# Patient Record
Sex: Female | Born: 1970 | Race: White | Hispanic: No | Marital: Married
Health system: Southern US, Community
[De-identification: ages and names within clinical notes are randomized; demographics above are authoritative.]

---

## 1998-08-28 ENCOUNTER — Other Ambulatory Visit: Admission: RE | Admit: 1998-08-28 | Discharge: 1998-08-28 | Payer: Self-pay | Admitting: Obstetrics and Gynecology

## 1999-10-08 ENCOUNTER — Other Ambulatory Visit: Admission: RE | Admit: 1999-10-08 | Discharge: 1999-10-08 | Payer: Self-pay | Admitting: Obstetrics and Gynecology

## 2000-11-14 ENCOUNTER — Other Ambulatory Visit: Admission: RE | Admit: 2000-11-14 | Discharge: 2000-11-14 | Payer: Self-pay | Admitting: Obstetrics and Gynecology

## 2001-12-25 ENCOUNTER — Other Ambulatory Visit: Admission: RE | Admit: 2001-12-25 | Discharge: 2001-12-25 | Payer: Self-pay | Admitting: Obstetrics and Gynecology

## 2003-01-07 ENCOUNTER — Other Ambulatory Visit: Admission: RE | Admit: 2003-01-07 | Discharge: 2003-01-07 | Payer: Self-pay | Admitting: Obstetrics and Gynecology

## 2004-01-29 ENCOUNTER — Other Ambulatory Visit: Admission: RE | Admit: 2004-01-29 | Discharge: 2004-01-29 | Payer: Self-pay | Admitting: Obstetrics and Gynecology

## 2005-03-07 ENCOUNTER — Other Ambulatory Visit: Admission: RE | Admit: 2005-03-07 | Discharge: 2005-03-07 | Payer: Self-pay | Admitting: Obstetrics and Gynecology

## 2006-03-29 ENCOUNTER — Ambulatory Visit: Payer: Self-pay | Admitting: Family Medicine

## 2006-09-14 ENCOUNTER — Inpatient Hospital Stay (HOSPITAL_COMMUNITY): Admission: AD | Admit: 2006-09-14 | Discharge: 2006-09-16 | Payer: Self-pay | Admitting: Obstetrics and Gynecology

## 2006-09-14 ENCOUNTER — Encounter (INDEPENDENT_AMBULATORY_CARE_PROVIDER_SITE_OTHER): Payer: Self-pay | Admitting: Specialist

## 2006-09-19 ENCOUNTER — Inpatient Hospital Stay (HOSPITAL_COMMUNITY): Admission: AD | Admit: 2006-09-19 | Discharge: 2006-09-21 | Payer: Self-pay | Admitting: Obstetrics and Gynecology

## 2006-09-19 ENCOUNTER — Ambulatory Visit: Payer: Self-pay | Admitting: Cardiology

## 2006-09-19 ENCOUNTER — Encounter: Payer: Self-pay | Admitting: Cardiology

## 2006-10-02 ENCOUNTER — Ambulatory Visit: Payer: Self-pay | Admitting: Cardiology

## 2006-11-24 ENCOUNTER — Ambulatory Visit: Payer: Self-pay | Admitting: Cardiology

## 2006-12-13 ENCOUNTER — Ambulatory Visit: Payer: Self-pay

## 2006-12-13 ENCOUNTER — Encounter: Payer: Self-pay | Admitting: Cardiology

## 2007-01-10 ENCOUNTER — Ambulatory Visit: Payer: Self-pay | Admitting: Family Medicine

## 2007-01-10 ENCOUNTER — Encounter: Payer: Self-pay | Admitting: Family Medicine

## 2007-01-10 DIAGNOSIS — J3089 Other allergic rhinitis: Secondary | ICD-10-CM | POA: Insufficient documentation

## 2007-01-10 DIAGNOSIS — R5383 Other fatigue: Secondary | ICD-10-CM

## 2007-01-10 DIAGNOSIS — J45909 Unspecified asthma, uncomplicated: Secondary | ICD-10-CM | POA: Insufficient documentation

## 2007-01-10 DIAGNOSIS — R609 Edema, unspecified: Secondary | ICD-10-CM | POA: Insufficient documentation

## 2007-01-10 DIAGNOSIS — F329 Major depressive disorder, single episode, unspecified: Secondary | ICD-10-CM

## 2007-01-10 DIAGNOSIS — F3289 Other specified depressive episodes: Secondary | ICD-10-CM | POA: Insufficient documentation

## 2007-01-10 DIAGNOSIS — R5381 Other malaise: Secondary | ICD-10-CM

## 2007-01-11 ENCOUNTER — Encounter: Payer: Self-pay | Admitting: Family Medicine

## 2007-01-12 LAB — CONVERTED CEMR LAB
ALT: 32 units/L (ref 0–40)
AST: 30 units/L (ref 0–37)
Bilirubin, Direct: 0.1 mg/dL (ref 0.0–0.3)
Creatinine, Ser: 0.8 mg/dL (ref 0.4–1.2)
Glucose, Bld: 94 mg/dL (ref 70–99)
HCT: 38.1 % (ref 36.0–46.0)
Potassium: 4.3 meq/L (ref 3.5–5.1)
Pro B Natriuretic peptide (BNP): 46 pg/mL (ref 0.0–100.0)
Total Bilirubin: 0.6 mg/dL (ref 0.3–1.2)
WBC: 6.3 10*3/uL (ref 4.5–10.5)

## 2007-01-16 ENCOUNTER — Encounter: Payer: Self-pay | Admitting: Family Medicine

## 2007-08-08 ENCOUNTER — Ambulatory Visit: Payer: Self-pay | Admitting: Family Medicine

## 2007-08-10 ENCOUNTER — Encounter: Admission: RE | Admit: 2007-08-10 | Discharge: 2007-08-10 | Payer: Self-pay | Admitting: Family Medicine

## 2007-08-13 ENCOUNTER — Telehealth (INDEPENDENT_AMBULATORY_CARE_PROVIDER_SITE_OTHER): Payer: Self-pay | Admitting: *Deleted

## 2007-08-13 ENCOUNTER — Encounter (INDEPENDENT_AMBULATORY_CARE_PROVIDER_SITE_OTHER): Payer: Self-pay | Admitting: Internal Medicine

## 2008-06-21 ENCOUNTER — Inpatient Hospital Stay (HOSPITAL_COMMUNITY): Admission: AD | Admit: 2008-06-21 | Discharge: 2008-06-24 | Payer: Self-pay | Admitting: Obstetrics and Gynecology

## 2008-06-25 ENCOUNTER — Inpatient Hospital Stay (HOSPITAL_COMMUNITY): Admission: AD | Admit: 2008-06-25 | Discharge: 2008-06-27 | Payer: Self-pay | Admitting: Obstetrics and Gynecology

## 2008-07-01 ENCOUNTER — Encounter: Payer: Self-pay | Admitting: Internal Medicine

## 2008-07-01 ENCOUNTER — Ambulatory Visit: Payer: Self-pay | Admitting: Internal Medicine

## 2008-07-01 ENCOUNTER — Ambulatory Visit: Payer: Self-pay

## 2008-07-01 LAB — CONVERTED CEMR LAB
Basophils Relative: 0.9 % (ref 0.0–3.0)
CO2: 28 meq/L (ref 19–32)
Chloride: 106 meq/L (ref 96–112)
GFR calc Af Amer: 90 mL/min
GFR calc non Af Amer: 74 mL/min
Glucose, Bld: 84 mg/dL (ref 70–99)
Hemoglobin: 11.9 g/dL — ABNORMAL LOW (ref 12.0–15.0)
MCHC: 33.3 g/dL (ref 30.0–36.0)
MCV: 85.6 fL (ref 78.0–100.0)
Monocytes Absolute: 0.8 10*3/uL (ref 0.1–1.0)
Neutrophils Relative %: 57.9 % (ref 43.0–77.0)
Platelets: 309 10*3/uL (ref 150–400)
Potassium: 3.6 meq/L (ref 3.5–5.1)
RBC: 4.18 M/uL (ref 3.87–5.11)
RDW: 12.7 % (ref 11.5–14.6)
WBC: 6.8 10*3/uL (ref 4.5–10.5)

## 2008-07-14 ENCOUNTER — Ambulatory Visit: Payer: Self-pay | Admitting: Internal Medicine

## 2008-07-14 LAB — CONVERTED CEMR LAB
BUN: 17 mg/dL (ref 6–23)
Basophils Relative: 1.7 % (ref 0.0–3.0)
CO2: 31 meq/L (ref 19–32)
Chloride: 104 meq/L (ref 96–112)
Creatinine, Ser: 0.8 mg/dL (ref 0.4–1.2)
Eosinophils Relative: 4.3 % (ref 0.0–5.0)
GFR calc Af Amer: 103 mL/min
Glucose, Bld: 95 mg/dL (ref 70–99)
HCT: 38.6 % (ref 36.0–46.0)
Hemoglobin: 13 g/dL (ref 12.0–15.0)
Lymphocytes Relative: 35.2 % (ref 12.0–46.0)
MCV: 83.7 fL (ref 78.0–100.0)
Monocytes Absolute: 0.7 10*3/uL (ref 0.1–1.0)
Neutro Abs: 2.6 10*3/uL (ref 1.4–7.7)
Platelets: 319 10*3/uL (ref 150–400)
RBC: 4.6 M/uL (ref 3.87–5.11)
WBC: 5.6 10*3/uL (ref 4.5–10.5)

## 2008-10-14 ENCOUNTER — Encounter: Payer: Self-pay | Admitting: Family Medicine

## 2008-12-24 ENCOUNTER — Encounter: Payer: Self-pay | Admitting: Family Medicine

## 2010-09-14 ENCOUNTER — Ambulatory Visit
Admission: RE | Admit: 2010-09-14 | Discharge: 2010-09-14 | Payer: Self-pay | Source: Home / Self Care | Attending: Family Medicine | Admitting: Family Medicine

## 2010-09-14 ENCOUNTER — Telehealth (INDEPENDENT_AMBULATORY_CARE_PROVIDER_SITE_OTHER): Payer: Self-pay | Admitting: *Deleted

## 2010-09-15 LAB — CONVERTED CEMR LAB
AST: 23 units/L (ref 0–37)
Albumin: 3.8 g/dL (ref 3.5–5.2)
Basophils Relative: 0.5 % (ref 0.0–3.0)
Bilirubin, Direct: 0.1 mg/dL (ref 0.0–0.3)
Calcium: 9 mg/dL (ref 8.4–10.5)
Chloride: 103 meq/L (ref 96–112)
Glucose, Bld: 80 mg/dL (ref 70–99)
HDL: 65.6 mg/dL (ref >39.00)
LDL Cholesterol: 101 mg/dL — ABNORMAL HIGH (ref 0–99)
Lymphocytes Relative: 29.8 % (ref 12.0–46.0)
Lymphs Abs: 1.5 10*3/uL (ref 0.7–4.0)
MCHC: 33.4 g/dL (ref 30.0–36.0)
Monocytes Absolute: 0.5 10*3/uL (ref 0.1–1.0)
Monocytes Relative: 10.2 % (ref 3.0–12.0)
Neutro Abs: 2.8 10*3/uL (ref 1.4–7.7)
Neutrophils Relative %: 57 % (ref 43.0–77.0)
Platelets: 263 10*3/uL (ref 150.0–400.0)
RBC: 3.83 M/uL — ABNORMAL LOW (ref 3.87–5.11)
Sodium: 137 meq/L (ref 135–145)
Total Protein: 7 g/dL (ref 6.0–8.3)
Triglycerides: 47 mg/dL (ref 0.0–149.0)
VLDL: 9.4 mg/dL (ref 0.0–40.0)

## 2010-09-16 ENCOUNTER — Ambulatory Visit
Admission: RE | Admit: 2010-09-16 | Discharge: 2010-09-16 | Payer: Self-pay | Source: Home / Self Care | Attending: Family Medicine | Admitting: Family Medicine

## 2010-09-16 DIAGNOSIS — R4589 Other symptoms and signs involving emotional state: Secondary | ICD-10-CM | POA: Insufficient documentation

## 2010-10-05 ENCOUNTER — Ambulatory Visit
Admission: RE | Admit: 2010-10-05 | Discharge: 2010-10-05 | Payer: Self-pay | Source: Home / Self Care | Attending: Psychology | Admitting: Psychology
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
## 2010-10-14 NOTE — Progress Notes (Signed)
----   Converted from flag ---- ---- 09/12/2010 4:50 PM, Judith Part MD wrote: please check wellness and lipid v70.0 thanks  ---- 09/10/2010 1:29 PM, Liane Comber CMA (AAMA) wrote: Lab orders please! Good Morning! This pt is scheduled for cpx labs Tuesday, which labs to draw and dx codes to use? Thanks Tasha ------------------------------

## 2010-10-19 ENCOUNTER — Ambulatory Visit (INDEPENDENT_AMBULATORY_CARE_PROVIDER_SITE_OTHER): Payer: PRIVATE HEALTH INSURANCE | Admitting: Psychology

## 2010-10-19 DIAGNOSIS — F4323 Adjustment disorder with mixed anxiety and depressed mood: Secondary | ICD-10-CM

## 2010-11-02 ENCOUNTER — Ambulatory Visit: Payer: PRIVATE HEALTH INSURANCE | Admitting: Psychology

## 2011-01-25 NOTE — H&P (Signed)
NAMEKARINNA, BEADLES NO.:  [id number]   MEDICAL RECORD NO.:  [id number]          PATIENT TYPE:  INP   LOCATION:                                FACILITY:  [facility]   PHYSICIAN:  Juluis Mire, M.D.   DATE OF BIRTH:  02/13/1971   DATE OF ADMISSION:  06/21/2008  DATE OF DISCHARGE:                              HISTORY & PHYSICAL   A 40 year old, gravida 3, para 1, abortus 1, female estimated  gestational age of [redacted] weeks presents for primary cesarean section.   In relation to present admission, the patient has been followed  throughout her pregnancy.  She has had a large estimated fetal weight  ultrasound done on June 16, 2008, revealed an estimated fetal weight  of 9-1/2 pound.  We discussed the option of proceeding with induction  versus primary cesarean section.  The patient has chosen primary  cesarean section with the concern of the shoulder dystocia.   Prenatal course was complicated by a first trimester screening that  indicated increased risk of Down syndrome.  We had discussed  amniocentesis declined by the patient.  Followup ultrasounds have been  normal.  Early ultrasound did reveal bilateral renal pyelectasis on the  baby; however, followup scans have revealed at that has resolved.   In terms of allergies, no known drug allergies.   MEDICATION:  Prenatal vitamins.   For past medical history, family history, and social history, please see  prenatal records.   REVIEW OF SYSTEMS:  Noncontributory.   PHYSICAL EXAMINATION:  VITAL SIGNS:  Afebrile, stable vital signs.  HEENT:  The patient is normocephalic.  Pupils equal and reactive to  light and accommodation.  Extraocular movements were intact.  Sclerae  and conjunctivae clear.  Oropharynx clear.  NECK:  Without thyromegaly.  BREASTS:  Not examined.  LUNGS:  Clear.  CARDIOVASCULAR:  Regular rhythm and rate without murmurs or gallops.  ABDOMEN:  Gravid uterus consistent with dates.  PELVIC:  Cervix is  1-2, 50% effaced, vertex at -1 station.  EXTREMITIES:  Trace edema.  NEUROLOGIC:  Grossly within normal limits.   IMPRESSION:  Intrauterine pregnancy at term with macrosomia.   PLAN:  The patient has decided to proceed with primary cesarean section.  The alternative of vaginal delivery have been discussed.  Risks of  cesarean section explained including the risk of infection, the risk of  hemorrhage that could require transfusion with the risk of AIDS or  hepatitis, excessive bleeding could require hysterectomy, there is  a risk of injury to adjacent organs including bladder, bowel, ureters  that could require further exploratory surgery, risk of deep venous  thrombosis and pulmonary embolus.  The patient expressed understanding  of the indications and risks.      Juluis Mire, M.D.  Electronically Signed     JSM/MEDQ  D:  06/21/2008  T:  06/21/2008  Job:  [id number]

## 2011-01-25 NOTE — Op Note (Signed)
NAMEAMRI, LIEN             ACCOUNT NO.:  [id number]   MEDICAL RECORD NO.:  [id number]          PATIENT TYPE:  INP   LOCATION:  9121                          FACILITY:  [facility]   PHYSICIAN:  Juluis Mire, M.D.   DATE OF BIRTH:  [birth year]   DATE OF PROCEDURE:  06/21/2008  DATE OF DISCHARGE:                               OPERATIVE REPORT   PREOPERATIVE DIAGNOSIS:  Pregnancy at term with macrosomia.   POSTOPERATIVE DIAGNOSIS:  Pregnancy at term with macrosomia.   PROCEDURE:  Low-transverse cesarean section.   SURGEON:  Juluis Mire, MD   ANESTHESIA:  Spinal.   ESTIMATED BLOOD LOSS:  500-600 mL.   PACKS AND DRAINS:  None.   INTRAOPERATIVE BLOOD PLACED:  None.   COMPLICATIONS:  None.   INDICATIONS:  Are as dictated in history and physical.   PROCEDURE IN DETAIL:  The patient was taken to the OR and placed in the  supine position with left lateral tilt.  After satisfactory level of  spinal anesthesia obtained, the abdomen was prepped out with Betadine  and draped in sterile field.  Low transverse skin incision was made with  a knife and carried through the subcutaneous tissue.  Fascia was entered  sharply and incision fashioned laterally.  Fascia taken off the muscle  superiorly and inferiorly.  Rectus muscles were separated in the  midline.  Perineum was entered sharp and incision of perineum extended  both superiorly and inferiorly.  A low transverse bladder flap was  developed.  A low transverse uterine incision was begun with a knife and  extended laterally using manual traction.  The infant delivered with  fundal pressure and use of the vacuum extractor.  The infant was a  viable female.  Apgars were 8 and 9, umbilical artery pH 7.30.  Weight is  pending.  Placenta was delivered manually and sent to L&D.  The uterus  was exteriorized for closure.  Uterus was closed with running locking  sutures  of 0 chromic using 2-layer closure technique.  We had good  hemostasis.   Urine output was clear and adequate.  Tubes and ovaries  were unremarkable.  Uterus was returned to the abdominal cavity.  We  irrigated the pelvis, had good hemostasis with clear urine output.  Muscles and peritoneum closed with running  suture of 3-0 Vicryl.  Fascia closed with running suture of 0 PDS.  Skin  was closed with staples and Steri-Strips.  Sponge, instrument, and  needle count was correct by circulating nurse x2.  Foley catheter  remained clear at the time of closure.  The patient tolerated the  procedure well and was returned to the recovery in good condition      Juluis Mire, M.D.  Electronically Signed     JSM/MEDQ  D:  06/21/2008  T:  06/21/2008  Job:  [id number]

## 2011-01-25 NOTE — Assessment & Plan Note (Signed)
[facility]                            CARDIOLOGY OFFICE NOTE   NAME:[name], [name]                    MRN:          [id number]  DATE:07/14/2008                            DOB:          05-09-1971    INTRODUCTION:  [name] is a pleasant 40 year old female with a  history of prior pregnancy-induced hypertension, who presents today for  followup after her recent pregnancy.   PROBLEMS:  1. Pregnancy-induced hypertension.  2. Obesity.  3. G2, P2.  4. Status post C-section on June 21, 2008.  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   MEDICATIONS:  1. Lasix 20 mg daily.  2. Tylenol p.r.n.   PROCEDURES:  Transthoracic echocardiogram on October, 20, 2009 revealed  a left ventricular ejection fraction of 55% with no left ventricular  regional wall motion abnormalities.  Mild mitral valve regurgitation was  noted.   INTERVAL HISTORY:  [name] presents today for followup.  She was  recently evaluated by me on July 01, 2008 after a recent pregnancy.  She was found to have significant volume overload upon examination and  complained of progressive shortness of breath and weight gain.  An  echocardiogram was performed, which revealed a preserved ejection  fraction as noted above.  The patient was initiated on Lasix 20 mg  daily.  She reports doing very well since that time.  Her shortness of  breath has resolved.  She denies chest pain, palpitations, presyncope,  or syncope.  The patient reports bitemporal headaches intermittently,  which she attributes to stress and sleep deprivation.  Her baby is doing  well, but sleeps approximately only an 1 hour between feedings and has  had some symptoms of reflux.  The patient is otherwise doing well today.   PHYSICAL EXAMINATION:  VITAL SIGNS:  Blood pressure 112/76, heart rate  72, weight 256 pounds, and respirations 18.  GENERAL:  The patient is a well-appearing female in no acute  distress.  She is alert and oriented x3.  HEENT:  Normocephalic and atraumatic.  Sclerae clear.  Conjunctivae  pink.  Oropharynx clear.  NECK:  Supple.  JVP 7 cm.  LUNGS:  Clear.  HEART:  Regular and rhythm.  No murmurs, rubs, or gallops.  GI:  Soft, nontender, and nondistended.  Positive bowel sounds.  EXTREMITIES:  No clubbing, cyanosis, or edema.  NEUROLOGIC:  Strength and sensation are intact.  SKIN:  No ecchymoses or lacerations.  MUSCULOSKELETAL:  No deformity or atrophy.   IMPRESSION:  [name] is a very pleasant female 40 year old female  with recent pregnancy, who now presents for followup of postpartum  volume retention.  Her recent echocardiogram reveals a preserved  ejection fraction.  Her symptoms of shortness of breath have resolved  with gentle diuresis.   PLAN:  1. Daily weights and salt restrictions were again encouraged.  2. The patient is encouraged to check her blood pressure several times      per week and to contact my office should she have significant      elevations in her blood pressure.  3. The patient is instructed to  decrease her Lasix to 20 mg every      other day x3 days and then discontinue Lasix.  Again, she is      instructed to contact my office should she have significant      symptoms of volume retention.  4. The patient will follow up with her obstetrician as previously      scheduled in 3 weeks.  She is aware that she may follow up in my      office at anytime.     Hillis Range, MD  Electronically Signed    JA/MedQ  DD: 07/14/2008  DT: 07/15/2008  Job #: [id number]   cc:   Duke Salvia. Marcelle Overlie, M.D.  Teresa Coombs, MD

## 2011-01-25 NOTE — Assessment & Plan Note (Signed)
Erin [name]                         ELECTROPHYSIOLOGY OFFICE NOTE   NAME:[name], [name]                    MRN:          [id number]  DATE:07/01/2008                            DOB:          01/25/1971    INTRODUCTION:  Erin [name] is a pleasant 40 year old female with a  history of prior pregnancy-induced hypertension, who now presents for  followup after a recent pregnancy.   INTERVAL HISTORY:  Erin [name] initially presented to [facility]  in 2008 following pregnancy.  At that time, she presented with symptoms  of progressive shortness of breath, volume overload.  A transthoracic  echocardiogram was performed which revealed a preserved ejection  fraction.  The patient was treated conservatively with Lasix and did  quite well.  She reports that following her early postpartum state, she  returned to her baseline state of health.  At baseline, the patient has  done well over the past year.  She reports good exercise tolerance  without chest pain, shortness of breath, or other symptoms.  She  subsequently has become pregnant again and delivered by C-section on  June 21, 2008.  She reports mild elevation in her blood pressure  preoperatively as well as postprocedural hypertension.  She returned to  the hospital and was admitted between June 25, 2008, and June 27, 2008.  She reports receiving intravenous Lasix with significant  improvement in volume overload.  She continues to have significant  orthopnea and sleeps upright at night.  She reports shortness of breath  with mental activities.  She reports chest soreness with deep  inspiration, but denies chest pain, pleuritic chest pain, palpitations,  nausea, vomiting, or presyncope.  She has had intermittent bitemporal  headaches over the past 2-3 days without changes in vision or neurologic  sequela.  She was evaluated by Dr. Marcelle Overlie in clinic today and was  referred to our office for  further evaluation.   PAST MEDICAL HISTORY:  1. Obesity.  2. G2, P2.  3. Status post C-section on June 21, 2008.  4. 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   MEDICATIONS:  1. Multivitamin daily.  2. Ibuprofen p.r.n.   ALLERGIES:  HYDROCODONE causes nausea.   SOCIAL HISTORY:  [place].  She  denies tobacco, alcohol, or drug use.   FAMILY HISTORY:  She denies hypertension, diabetes, or coronary artery  disease.   REVIEW OF SYSTEMS:  All systems are reviewed and negative except as  outlined in the HPI above and as documented below.  The patient reports  loose watery stools over the past 3 days.   PHYSICAL EXAMINATION:  VITAL SIGNS:  Blood pressure 136/92, heart rate  59, respirations 18, weight 261 pounds.  GENERAL:  The patient is an obese female, who is recovering postpartum.  She is in no acute distress.  She is alert and oriented x3.  HEENT:  Normocephalic, atraumatic.  Sclerae clear.  Conjunctiva pink.  Oropharynx clear.  NECK:  Supple.  No lymphadenopathy or thyromegaly.  JVP 12 cm.  LUNGS:  Few bibasilar rales are noted with a normal work of breathing.  HEART:  Regular rate and rhythm.  A 2/6 flow murmur is noted along the  left sternal border and S3 is noted.  GI:  Soft, nontender, nondistended, positive bowel sounds.  She has a  healing surgical scar.  EXTREMITIES:  A 2+ pitting edema bilaterally.  NEUROLOGIC:  Strength and sensation are intact.  SKIN:  No ecchymosis or laceration.  MUSCULOSKELETAL:  No deformity or atrophy.  PSYCH:  Euthymic mood.  Full affect.   EKG today reveals sinus bradycardia at 58 beats per minute and is  otherwise unremarkable.   Transthoracic echocardiogram performed today, reveals an ejection  fraction of 55% with no regional wall motion abnormalities.  There is no  significant valvular abnormality.   IMPRESSION:  Erin [name] is a very pleasant 40 year old female with  recent  pregnancy, who now presents with postpartum volume retention and  symptoms of heart failure.  She has a preserved ejection fraction on  echocardiogram today and no ischemic changes on her EKG.  Her clinical  exam is consistent with fluid overload and I think that this is the  cause for her symptoms.   PLAN:  Therapeutic strategies for postpartum volume retention were  discussed in detail with the patient.  At the present time, I think the  most prudent strategy is salt restriction and gentle diuresis.  I  therefore placed the patient on Lasix 20 mg twice daily for 3 days, then  20 mg daily thereafter.  She is instructed to follow her weight daily.  We will obtain a complete metabolic profile today.  I have asked the  patient to return to clinic to see me in 1 week to assess her progress.  She is aware that should any problems arise that she should contact our  office.     Hillis Range, MD  Electronically Signed    JA/MedQ  DD: 07/01/2008  DT: 07/02/2008  Job #: [id number]   cc:   Duke Salvia. Marcelle Overlie, M.D.  Juluis Mire, M.D.

## 2011-01-28 NOTE — Discharge Summary (Signed)
NAMEGRAYSON, WHITE             ACCOUNT NO.:  [id number]   MEDICAL RECORD NO.:  [id number]          PATIENT TYPE:  INP   LOCATION:  9121                          FACILITY:  [facility]   PHYSICIAN:  Freddy Finner, M.D.   DATE OF BIRTH:  04-29-1971   DATE OF ADMISSION:  06/21/2008  DATE OF DISCHARGE:  06/24/2008                               DISCHARGE SUMMARY   ADMITTING DIAGNOSES:  1. Intrauterine pregnancy at 68 weeks' estimated gestational age.  2. Macrosomia.   DISCHARGE DIAGNOSES:  1. Status post low-transverse cesarean section.  2. Viable female infant.   PROCEDURE:  Primary low-transverse cesarean section.   REASON FOR ADMISSION:  Please see dictated H&P.   HOSPITAL COURSE:  The patient is a 40 year old gravida 3, para 1 that  was admitted to [facility] for scheduled cesarean  section.  The patient had undergone an ultrasound in the office for  estimate of fetal weight was greater than 9-1/2 pounds.  Decision was  made to schedule the patient for primary cesarean delivery due to  concern regarding possible shoulder dystocia.  On the morning of  admission, the patient was taken to the operating room where spinal  anesthesia was administered without difficulty.  A low-transverse  incision was made with delivery of a viable female infant weighing 10  pounds 5 ounces with Apgars of 8 at one minute and 9 at five minutes.  Arterial cord pH was 7.30.  The patient tolerated procedure well and was  taken to recovery room in stable condition.  On postoperative day #1,  the patient was without complaint.  Vital signs were stable.  She was  afebrile.  Abdomen soft.  Fundus firm and nontender.  Abdominal dressing  noted to be clean, dry, and intact.  Laboratory findings showed  hemoglobin of 10.3.  Postoperative day #2, the patient was without  complaint.  Vital signs were stable.  She was afebrile.  Fundus firm and  nontender.  Abdominal dressing had been removed revealing an  incision  that was clean, dry, and intact.  On postoperative day #3, the patient  continued to be without complaint.  Vital signs were stable.  She was  afebrile.  Fundus firm and nontender.  Incision was clean, dry, and  intact.  Staples removed, and the patient was later discharged home.   CONDITION ON DISCHARGE:  Stable.   DIET:  Regular as tolerated.   ACTIVITY:  No heavy lifting, no driving x2 weeks, no vaginal entry.   FOLLOWUP:  The patient to follow up in the office in 1-2 weeks for an  incision check.  She is to call for temperature greater than 100  degrees, persistent nausea, vomiting, heavy vaginal bleeding, and/or  redness or drainage from the incisional site.   DISCHARGE MEDICATIONS:  Tylox #30, 1 p.o. every 4-6 hours.  Motrin 600  mg every 6 hours.  Prenatal vitamins 1 p.o. daily.  Colace 1 p.o. daily.      Julio Sicks, N.P.      Freddy Finner, M.D.  Electronically Signed    CC/MEDQ  D:  07/24/2008  T:  07/24/2008  Job:  [id number]

## 2011-01-28 NOTE — Consult Note (Signed)
[name], [name]             ACCOUNT NO.:  [id number]   MEDICAL RECORD NO.:  [id number]          PATIENT TYPE:  INP   LOCATION:  9374                          [facility]  [facility]   PHYSICIAN:  Madolyn Frieze. Jens Som, MD, FACCDATE OF BIRTH:  1970/12/18   DATE OF CONSULTATION:  09/19/2006  DATE OF DISCHARGE:                                 CONSULTATION   [name] is a 40 year old female who is status post recent delivery  who we are asked to evaluate for chest pain and shortness of breath.  She has no prior cardiac history.  She had an uncomplicated vaginal  delivery on September 14, 2006.  Note, this is her first child.  Since that  time, she has noticed increased dyspnea on exertion as well as orthopnea  and pedal edema.  She also has chest tightness but has noticed this  increases with stressful situations in the past and this is similar.  Note, her daughter is at [facility] with increased bilirubin.  Because of her dyspnea and chest tightness, we were asked to further  evaluate.  Also of note, she has no history of syncope.   MEDICATIONS AT HOME:  Multivitamin.   FAMILY HISTORY:  Negative for coronary artery disease.   PAST MEDICAL HISTORY:  Unremarkable with no hypertension, diabetes  mellitus, or hyperlipidemia.  She has no prior surgical history.   ALLERGIES:  HYDROCODONE.   REVIEW OF SYSTEMS:  She denies any headaches, fevers or chills.  There  is no productive cough or hemoptysis.  There is no dysphagia,  odynophagia, melena, or hematochezia.  There is no dysuria or hematuria.  No seizure activity.  She does have orthopnea but there is no PND.  She  has noticed worsening pedal edema.  She is under increased stress due to  her child being at [facility].  The remainder of the review of  systems are negative.   PHYSICAL EXAMINATION:  VITAL SIGNS:  Blood pressure 144/91 and pulse is  45.  GENERAL:  She is well-developed and somewhat obese.  She is in no  acute  distress.  She does not appear to be depressed.  SKIN:  Warm and dry.  There is no peripheral clubbing.  BACK:  Normal.  HEENT:  Unremarkable with normal eyelids.  NECK:  Supple with a normal upstroke bilaterally and I could not  appreciate bruits.  There is no jugular venous distension.  I cannot  appreciate thyromegaly.  CHEST:  Clear to auscultation, normal expansion.  CARDIOVASCULAR:  Exam reveals a regular rhythm but a bradycardic rate.  There are no murmurs, rubs or gallops noted.  ABDOMEN:  Significant for right upper quadrant tenderness.  I cannot  palpate masses or hepatomegaly.  There was no abdominal bruit.  She has  2+ femoral pulses bilaterally.  No bruits.  EXTREMITIES:  Show 1+ edema bilaterally.  I cannot palpate cords.  She  has 1+ dorsalis pedis pulses bilaterally.  NEUROLOGICAL:  Exam is grossly intact.   LABORATORY DATA:  Her electrocardiogram shows a marked sinus bradycardia  rate at 45.  The axis is  normal.  There are no ST changes noted.  Her  chest x-ray shows mild cardiomegaly and interstitial edema.  Urinalysis  is unremarkable.  Her sodium is 141, potassium 5, BUN 15, creatinine  0.82.  Her albumin is 2.3.  Liver functions elevated with an SGOT of 65  and SGPT of 61.  Her bilirubin and alkaline phosphatase are normal.  Her  white blood cell count is 8.4, hemoglobin 10.1, hematocrit 29.1,  platelet count is 285.   DIAGNOSIS:  1. Mild volume overload/pulmonary edema.  2. Possible pregnancy induced hypertension.   PLAN:  [name] has been admitted with symptoms of dyspnea, pedal  edema, orthopnea, and has evidence of edema on her x-ray.  I think we  should proceed with diuresis and we will need to follow her renal  function closely.  We will schedule an echocardiogram to quantify left  ventricular function.  Her blood pressure also needs to be controlled  and IV magnesium has been initiated.  We will be happy to follow while  she is in the  hospital.  Note, her heart rate is decreased but there is  no symptoms related to this including no history of syncope.  I would  recommend following on telemetry.  If there is no further bradycardia  and no symptoms, then there is no therapy indicated for this is.  We  will be happy to follow while she is in the hospital.      Madolyn Frieze. Jens Som, MD, [facility]  Electronically Signed     BSC/MEDQ  D:  09/19/2006  T:  09/19/2006  Job:  [id number]

## 2011-01-28 NOTE — Assessment & Plan Note (Signed)
[facility] HEALTHCARE                            CARDIOLOGY OFFICE NOTE   [name]                    MRN:          [id number]  DATE:10/02/2006                            DOB:          06/17/1971    [name] is a very pleasant 40 year old female that I recently saw  at [facility] after delivery of her first child.  At that time,  she had noticed dyspnea and chest tightness.  She also had an  electrocardiogram that showed a marked sinus bradycardia with a rate of  45.  We felt that she was volume overloaded and gave her Lasix with  significant improvement in her symptoms.  An echocardiogram was  performed on September 19, 2006.  Her LV function was felt to be low normal  and the estimated ejection fraction was 50%.  There was no other  abnormalities noted per Dr. Myrtis Ser' reading.  Also note her liver  functions were minimally elevated as well and she was treated for  possible pregnancy-induced hypertension with magnesium.  Since  discharge, she is much improved.  She denies any dyspnea, chest pain,  palpitations, syncope, chest pain or pedal edema.  Her medications at  present include Lasix 10 mg p.o. daily and a prenatal vitamin.   PHYSICAL EXAMINATION:  Her physical examination today shows a blood  pressure of 132/74 and her pulse is 73.  CHEST:  Clear.  CARDIOVASCULAR:  Regular rate and rhythm.  EXTREMITIES:  Show no edema.   Electrocardiogram shows a sinus rhythm at a rate of 73.  There are no ST  changes noted.   DIAGNOSES:  1. Status post recent delivery of her first child.  2. Probable recent pregnancy-induced hypertension now improved.  3. Volume overload, now improved.   PLAN:  [name] is much improved since discharge.  I have asked her  to discontinue her Lasix and follow her symptoms.  If she develops  worsening shortness of breath or weight gain, then we can resume that as  needed.  I will see her back in approximately  3 months and we will most  likely repeat her electrocardiogram at that time.  Her EF previously was  low normal in the 50% range.  I want to make sure that she is not  developing a cardiomyopathy.     Erin Frieze Jens Som, MD, [facility]  Electronically Signed    BSC/MedQ  DD: 10/02/2006  DT: 10/02/2006  Job #: [id number]

## 2011-01-28 NOTE — Assessment & Plan Note (Signed)
[facility] HEALTHCARE                            CARDIOLOGY OFFICE NOTE   [name], [name]                    MRN:          [id number]  DATE:11/24/2006                            DOB:          Dec 06, 1970    [name] returns for followup today.  Please refer to my previous  notes for details.  Since I last saw her, she is doing well.  She is  back to exercising, using the elliptical trainer, and lifting weights.  She denies any dyspnea, orthopnea, PND, chest pain, palpitations, or  syncope.  She does state that there is mild pedal edema at times.   Her medications are prenatal vitamins and Tri-Levlen 28.   PHYSICAL EXAMINATION:  Shows a blood pressure of 121/74 and her pulse is  58.  Her chest is clear.  CARDIAC EXAM:  Regular rate and rhythm.  EXTREMITIES:  Show trace edema.   DIAGNOSES:  1. Recent probable pregnancy-induced hypertension.  2. Volume overload, now improved.  3. History of ejection fraction of approximately 50%.   PLAN:  [name] is much better since I last saw her.  She has had no  dyspnea, although she does have minimal pedal edema off of her Lasix.  I  think we can continue with her present medications, and she is  continuing with diet and exercise.  We will plan to repeat her  echocardiogram to make sure that she did not develop a pregnancy-induced  cardiomyopathy.  If her ejection fraction is 50% or higher, then I will  see her back on an as needed basis.     Madolyn Frieze Jens Som, MD, [facility]  Electronically Signed    BSC/MedQ  DD: 11/24/2006  DT: 11/25/2006  Job #: [id number]

## 2011-01-28 NOTE — Discharge Summary (Signed)
[name], HOCEVAR             ACCOUNT NO.:  [id number]   MEDICAL RECORD NO.:  [id number]          PATIENT TYPE:  INP   LOCATION:  9372                          [facility]  [facility]   PHYSICIAN:  Michelle L. Grewal, M.D.DATE OF BIRTH:  10-01-1970   DATE OF ADMISSION:  06/25/2008  DATE OF DISCHARGE:  06/27/2008                               DISCHARGE SUMMARY   ADMITTING DIAGNOSIS:  Status post cesarean delivery with postpartum  pregnancy-induced hypertension.   DISCHARGE DIAGNOSIS:  Status post cesarean delivery with postpartum  pregnancy-induced hypertension, resolving.   REASON FOR ADMISSION:  Please see written H&P.   HOSPITAL COURSE:  The patient is a 40 year old female that delivered on  June 21, 2008, that presented to the office with complaints of  shortness of breath and increase in lower extremity edema.  The patient  had had postpartum PIH with her last pregnancy.  The patient was now and  observed in the MAU where blood pressure was 130/88 with mild elevation  in SGOT and SGPT.  Oxygen saturations were 98% on room air with chest x-  ray revealing some mild cardiomyopathy with no overt pulmonary edema.  Vital signs were otherwise stable.  Lungs were clear with good air  movement.  Abdomen benign.  The patient was now admitted for magnesium  sulfate administration, IV Lasix, and continued monitoring.  On the  following morning, the patient did have a mild headache, otherwise she  was without complaint.  Vital signs were stable with blood pressure  130/80s, weight was noted to be down 2 pounds, oxygen sat was 97-100%,  urine output had been good approximately 200 mL per hour.  Laboratory  findings revealed hemoglobin of 10.0, platelet count of 319,000, SGOT  was 52, and SGPT was 45.  Edema remained 3+.  Lungs were clear to  auscultation.  Deep tendon reflexes were 3+.  The patient continued on  magnesium sulfate and was given some Lasix IV and total amount of IV  fluids  was decreased and laboratories were ordered for the following  morning.  On hospital day #3, the patient was feeling better.  She  denied headache, blurred vision, or right upper quadrant pain.  Vital  signs were stable.  She was afebrile.  Lungs were clear to auscultation.  Abdomen was soft with incision clean, dry, and intact.  Edema was now  1+.  PIH labs were improving, discharge was discussed with the patient,  magnesium sulfate was discontinued, and the patient was later discharged  to home.   CONDITION ON DISCHARGE:  Stable.   DIET:  Regular as tolerated.   ACTIVITY:  As tolerated.   INSTRUCTIONS:  The patient was encouraged to call for headache, blurred  vision, or right upper quadrant pain or increasing edema or feeling  jittery.  The patient was also instructed no heavy lifting, no driving  for 2 weeks, no vaginal entry.   DISCHARGE MEDICATIONS:  Percocet 1-2 tablets every 4-6 hours as needed  for pain.  Motrin 600 mg every 6 hours as needed.  Continue prenatal  vitamins one p.o. daily taking  Julio Sicks, N.P.      Stann Mainland. Vincente Poli, M.D.  Electronically Signed    CC/MEDQ  D:  07/24/2008  T:  07/24/2008  Job:  [id number]

## 2011-01-28 NOTE — Discharge Summary (Signed)
[name], [name]             ACCOUNT NO.:  [id number]   MEDICAL RECORD NO.:  [id number]          PATIENT TYPE:  INP   LOCATION:  9374                          [facility]   PHYSICIAN:  Michelle L. Grewal, M.D.DATE OF BIRTH:  [birth year]   DATE OF ADMISSION:  09/19/2006  DATE OF DISCHARGE:  09/21/2006                               DISCHARGE SUMMARY   ADMISSION DIAGNOSES:  1. Status post vaginal delivery with postpartum preeclampsia.  2. Pulmonary edema.  3. Bradycardia.   DISCHARGE DIAGNOSIS:  Postpartum pulmonary edema.   REASON FOR ADMISSION:  Please see written H&P.   HOSPITAL COURSE:  The patient is a 40 year old white married female,  gravida 2, para 1 who was status post vaginal delivery on September 13, 2006. The patient did complain of some increase in pedal edema over the  last 2 days. Patient also complained of some chest tightness,  particularly when lying in the left position. Patient also had noted a  mild headache over the last 2-3 days. She denied any dizziness or  syncope, nausea or vomiting.   On admission her vital signs were stable with blood pressure 144/91,  147/89 to 149/100, pulse rate was in the 40's, respiratory rate was 20  and temperature was 98 even. Lungs were clear to auscultation. Heart was  regular rate and rhythm in the 40's with EKG revealing some sinus  bradycardia. Abdomen was soft with some epigastric tenderness noted.  Lower extremities were noted to have 3+ pedal edema. Deep tendon  reflexes 2+ without clonus.  Chest x-ray did reveal some interstitial  edema. Laboratories were fine with exception of SGOT of 65 and SGPT of  61. The patient was admitted for further evaluation, IV magnesium  sulfate and Lasix. Cardiac consult was obtained as well as an  echocardiogram performed. EKG did reveal marked sinus bradycardia at a  rate of 45, axis was normal, there were no ST changes.  Chest x-ray did  reveal some mild cardiomegaly with some  interstitial edema.  Urinalysis  was unremarkable. Sodium was 141, potassium 5, BUN 15, creatinine 0.82,  albumin 2.3. Liver function tests reveal SGOT of 65, SGPT of 61,  bilirubin and alkaline phosphatase were within normal limits. Platelet  count was 285,000 and hemoglobin was 10.1.  An admitting diagnosis was  made of mild volume overload with pulmonary edema and possible pregnancy  induced hypertension.  The patient continued on magnesium sulfate. On  the following morning she continued to have a mild headache. Blood  pressure's were 119-148/60 to 70's. SGOT was now 89, SGPT was 86. Urine  output was minus 18 over the last 12 hours. Deep tendon reflexes were 1+  without clonus, and 3+ pitting edema. IV Lasix was given and she  continued to be observed closely.   On the following morning patient was feeling better, vital signs were  stable, urine output was good. Lungs were clear to auscultation. Abdomen  soft. Labs were improved with LFTs noted to be decreasing.  Magnesium  sulfate was discontinued and patient was given a prescription for Lasix  10 mg one p.o.  daily and she is to follow-up in the office in  approximately 1 week.   CONDITION ON DISCHARGE:  Stable.   DIET:  As tolerated.   ACTIVITY:  Up as tolerated.   MEDICATIONS:  1. Lasix 10 mg one p.o. daily.  2. Prenatal vitamins one p.o. daily.   Instructions were given:  Patient to call for headache, blurred vision  or right upper quadrant pain or increase in pitting edema.      Julio Sicks, N.P.      Stann Mainland. Vincente Poli, M.D.  Electronically Signed    CC/MEDQ  D:  11/12/2006  T:  11/12/2006  Job:  [id number]

## 2011-01-28 NOTE — Op Note (Signed)
NAMEARNEDA, SAPPINGTON             ACCOUNT NO.:  [id number]   MEDICAL RECORD NO.:  [id number]          PATIENT TYPE:  INP   LOCATION:  9169                          [facility]ACILITY:  [facility]   PHYSICIAN:  Guy Sandifer. Henderson Cloud, M.D. DATE OF BIRTH:  Oct 23, 1970   DATE OF PROCEDURE:  09/14/2005  DATE OF DISCHARGE:                               OPERATIVE REPORT   PROCEDURE:  Vacuum extraction.   INDICATIONS AND CONSENT:  The patient is a 40 year old married white  female, G2, P0, at 11 1/7 weeks with spontaneous rupture of membranes.  She progresses in labor to complete pushing.  She has been pushing for  approximately 1 1/2 hours.  Vertex is crowning with the push and there  is deep decelerations to the 40s to 60s with each push.  There is a late  component to the contractions that is growing increasingly long.  Vacuum  extraction to shorten the second stage in view of the prolonged  deceleration is discussed with the patient and her husband.  The  potential risks and complications are discussed including a 1 in 40,000  risk of severe morbidity or mortality.  All questions are answered.  Bladder is straight catheterized.  The Kiwi vacuum extractor is placed.  There are no pop-offs.  Over the course of two contractions, the vertex  was delivered without difficulty.  The remainder of the baby was  delivered without difficulty.  Peds is present.  A viable female infant,  Apgars 9 and 9 at 1 and 5 minutes respectively is obtained.  Arterial  cord pH 7.27 is noted.  Placenta is delivered intact and sent to  pathology.  The cervix and vagina without laceration.  Second degree  midline episiotomy is noted and repaired.  Patient stable to labor and  delivery and the baby is in the nursery with pediatrics.      Guy Sandifer Henderson Cloud, M.D.  Electronically Signed     JET/MEDQ  D:  09/14/2006  T:  09/14/2006  Job:  [id number]

## 2011-03-18 ENCOUNTER — Inpatient Hospital Stay (INDEPENDENT_AMBULATORY_CARE_PROVIDER_SITE_OTHER)
Admission: RE | Admit: 2011-03-18 | Discharge: 2011-03-18 | Disposition: A | Discharge status: Home / Self Care | Payer: PRIVATE HEALTH INSURANCE | Source: Ambulatory Visit | Attending: Family Medicine | Admitting: Family Medicine

## 2011-03-18 DIAGNOSIS — J019 Acute sinusitis, unspecified: Secondary | ICD-10-CM

## 2011-06-13 LAB — LACTATE DEHYDROGENASE
LDH: 212
LDH: 236

## 2011-06-13 LAB — COMPREHENSIVE METABOLIC PANEL
ALT: 40 — ABNORMAL HIGH
ALT: 45 — ABNORMAL HIGH
AST: 33
Albumin: 2.3 — ABNORMAL LOW
Albumin: 2.4 — ABNORMAL LOW
Alkaline Phosphatase: 107
Alkaline Phosphatase: 99
CO2: 25
CO2: 26
Calcium: 7.7 — ABNORMAL LOW
Calcium: 8.7
Chloride: 105
Creatinine, Ser: 0.73
Creatinine, Ser: 0.79
GFR calc Af Amer: >60
GFR calc non Af Amer: >60
Glucose, Bld: 122 — ABNORMAL HIGH
Potassium: 4
Potassium: 4.2
Sodium: 137
Sodium: 138
Total Protein: 4.9 — ABNORMAL LOW
Total Protein: 5 — ABNORMAL LOW

## 2011-06-13 LAB — URIC ACID
Uric Acid, Serum: 6.6
Uric Acid, Serum: 7.8 — ABNORMAL HIGH

## 2011-06-13 LAB — CBC
HCT: 29.9 — ABNORMAL LOW
HCT: 35.4 — ABNORMAL LOW
Hemoglobin: 11.7 — ABNORMAL LOW
MCHC: 33.1
MCHC: 33.5
MCHC: 34
MCV: 84.9
MCV: 85.2
MCV: 85.6
MCV: 85.9
Platelets: 296
Platelets: 307
Platelets: 319
RBC: 3.41 — ABNORMAL LOW
RBC: 3.5 — ABNORMAL LOW
RBC: 3.53 — ABNORMAL LOW
RBC: 3.58 — ABNORMAL LOW
RDW: 14.1
WBC: 7.3

## 2011-06-13 LAB — DIFFERENTIAL
Basophils Absolute: 0
Eosinophils Absolute: 0.2
Lymphocytes Relative: 23
Monocytes Relative: 14 — ABNORMAL HIGH

## 2011-06-13 LAB — GLUCOSE, CAPILLARY: Glucose-Capillary: 95

## 2012-07-20 ENCOUNTER — Other Ambulatory Visit: Payer: Self-pay | Admitting: Gastroenterology

## 2012-07-20 DIAGNOSIS — R110 Nausea: Secondary | ICD-10-CM

## 2012-07-20 DIAGNOSIS — R109 Unspecified abdominal pain: Secondary | ICD-10-CM

## 2012-07-25 ENCOUNTER — Ambulatory Visit
Admission: RE | Admit: 2012-07-25 | Discharge: 2012-07-25 | Disposition: A | Discharge status: Home / Self Care | Payer: PRIVATE HEALTH INSURANCE | Source: Ambulatory Visit | Attending: Gastroenterology | Admitting: Gastroenterology

## 2012-07-25 DIAGNOSIS — R109 Unspecified abdominal pain: Secondary | ICD-10-CM

## 2012-07-25 DIAGNOSIS — R110 Nausea: Secondary | ICD-10-CM

## 2012-08-27 ENCOUNTER — Other Ambulatory Visit: Payer: Self-pay | Admitting: Gastroenterology

## 2014-03-25 ENCOUNTER — Other Ambulatory Visit: Payer: Self-pay | Admitting: Obstetrics and Gynecology

## 2014-03-25 DIAGNOSIS — R928 Other abnormal and inconclusive findings on diagnostic imaging of breast: Secondary | ICD-10-CM

## 2014-04-01 ENCOUNTER — Ambulatory Visit
Admission: RE | Admit: 2014-04-01 | Discharge: 2014-04-01 | Disposition: A | Discharge status: Home / Self Care | Payer: PRIVATE HEALTH INSURANCE | Source: Ambulatory Visit | Attending: Obstetrics and Gynecology | Admitting: Obstetrics and Gynecology

## 2014-04-01 DIAGNOSIS — R928 Other abnormal and inconclusive findings on diagnostic imaging of breast: Secondary | ICD-10-CM

## 2020-08-12 ENCOUNTER — Other Ambulatory Visit: Payer: Self-pay | Admitting: Obstetrics and Gynecology

## 2020-08-12 DIAGNOSIS — Z9189 Other specified personal risk factors, not elsewhere classified: Secondary | ICD-10-CM

## 2020-09-03 ENCOUNTER — Ambulatory Visit
Admission: RE | Admit: 2020-09-03 | Discharge: 2020-09-03 | Disposition: A | Discharge status: Home / Self Care | Payer: PRIVATE HEALTH INSURANCE | Source: Ambulatory Visit | Attending: Obstetrics and Gynecology | Admitting: Obstetrics and Gynecology

## 2020-09-03 DIAGNOSIS — Z9189 Other specified personal risk factors, not elsewhere classified: Secondary | ICD-10-CM

## 2020-09-03 MED ORDER — GADOBUTROL 1 MMOL/ML IV SOLN
9.0000 mL | Freq: Once | INTRAVENOUS | Status: AC | PRN
  Administered 2020-09-03: 9 mL via INTRAVENOUS

## 2021-03-08 IMAGING — MR MR BREAST BILAT WO/W CM
8 of 12 series · 33 of 48 positions shown · IV contrast (9 ML GADAVIST)
Comparison: No prior MRI. Multiple prior mammograms, most recently
08/05/2020.

CLINICAL DATA: 49-year-old for high risk supplemental screening
MRI. Family history of breast cancer in her sister at age 50 and in
her maternal grandmother at age 70. Estimated lifetime risk of
breast cancer 35%

LABS:  Not applicable.
EXAM:
BILATERAL BREAST MRI WITH AND WITHOUT CONTRAST
TECHNIQUE: Multiplanar, multisequence MR images of both breasts were obtained
prior to and following the intravenous administration of 9 ml of
Gadavist.

[Series 2: t2_tirm_tra ipat (a-p) · axial · 3.0mm · 0.70mm/px · 1 of 55 slices shown]
[im 1/55]
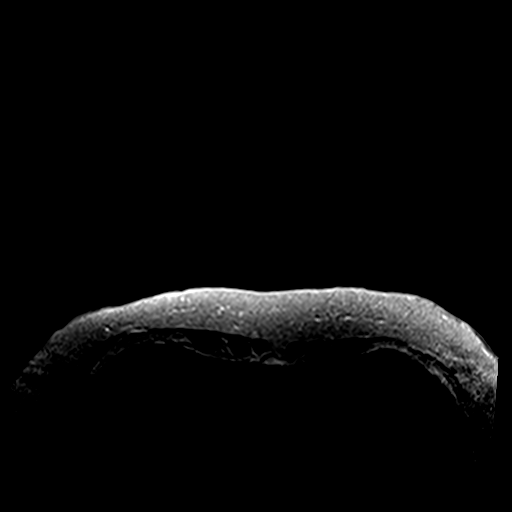

[Series 3: fl3d pre-cm no · axial · non-contrast · 1.2mm · 0.94mm/px · z∈[-69,+103]mm · 5 of 144 slices shown]
[im 1/144]
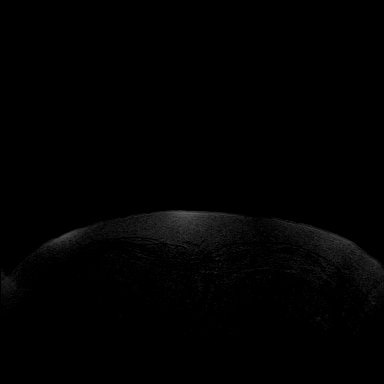
[im 36/144]
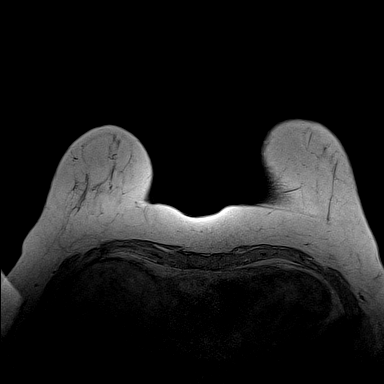
[im 72/144]
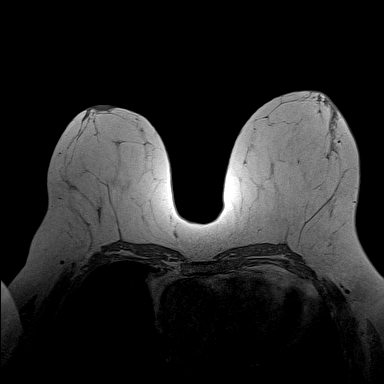
[im 108/144]
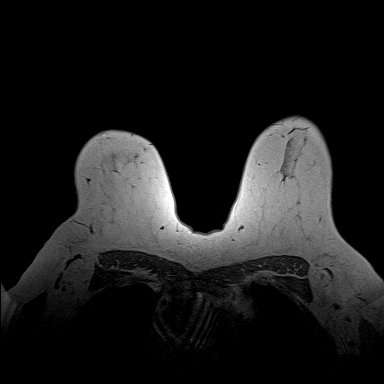
[im 144/144]
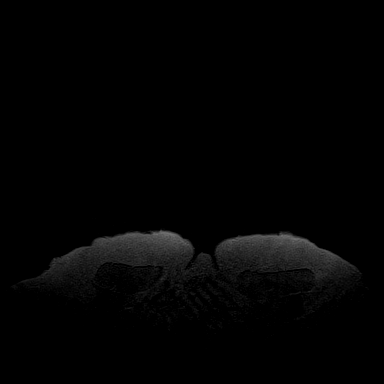

[Series 4: fl3d pre-cm · axial · non-contrast · 1.2mm · 0.94mm/px · z∈[-69,+103]mm · 5 of 144 slices shown]
[im 1/144]
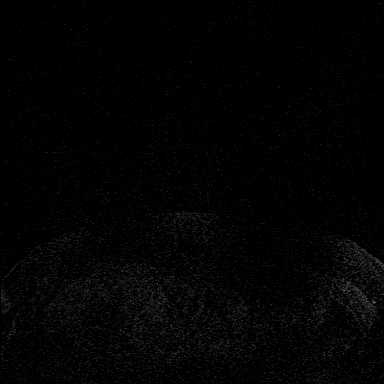
[im 36/144]
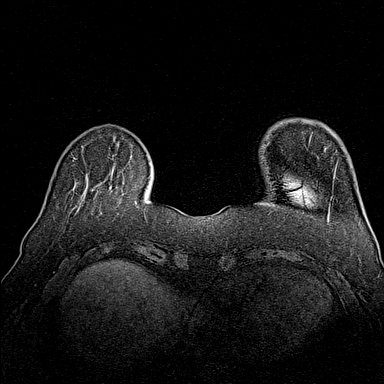
[im 72/144]
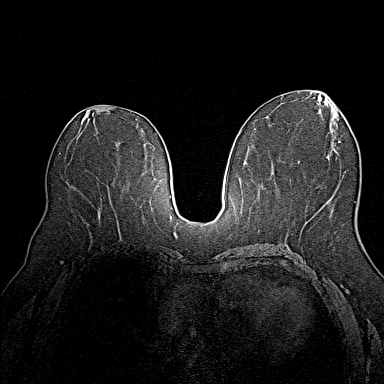
[im 108/144]
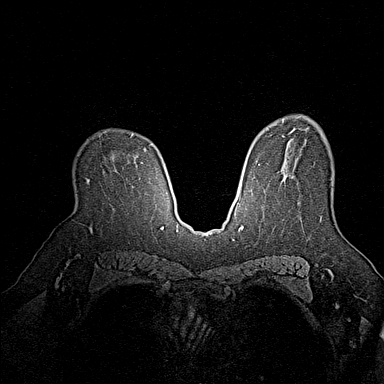
[im 144/144]
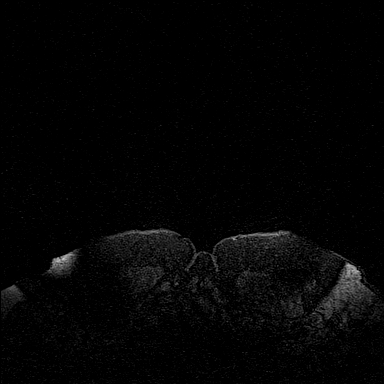

[Series 5: fl3d post-cm 20 · axial · 1.2mm · 0.94mm/px · z∈[-69,+103]mm · 5 of 144 slices shown (1 of 3)]
[im 1/144]
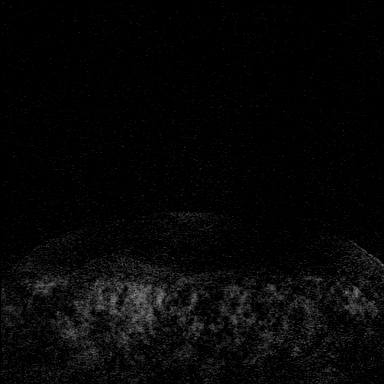
[im 36/144]
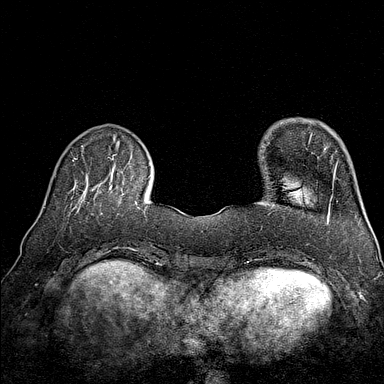
[im 72/144]
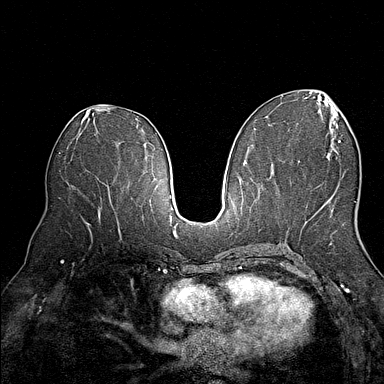
[im 108/144]
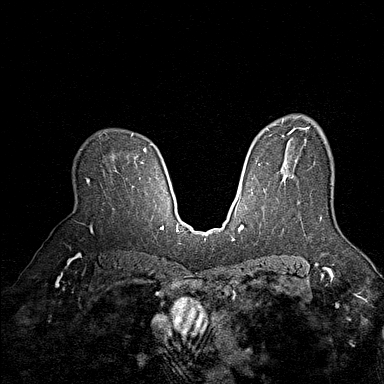
[im 144/144]
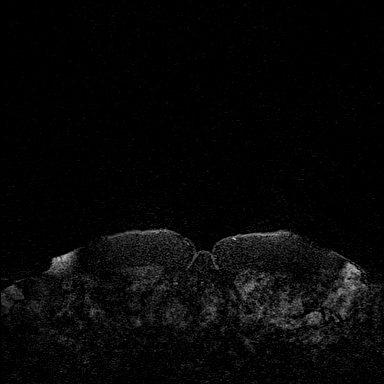

[Series 6: fl3d post-cm 20 · axial · 1.2mm · 0.94mm/px · z∈[-69,+103]mm · 5 of 144 slices shown (2 of 3)]
[im 1/144]
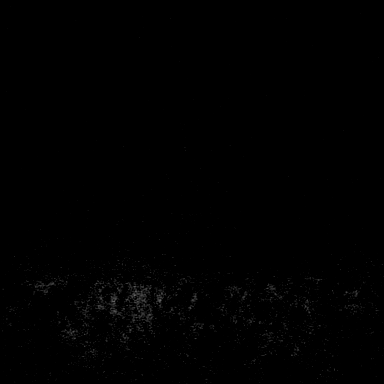
[im 36/144]
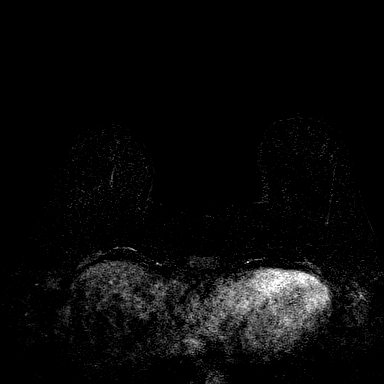
[im 72/144]
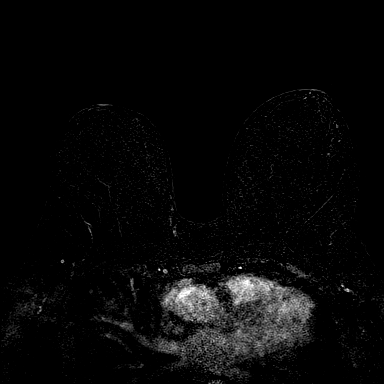
[im 108/144]
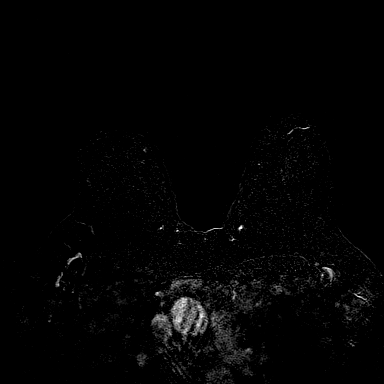
[im 144/144]
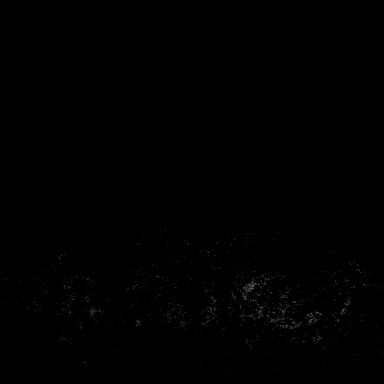

[Series 7: fl3d post-cm 20 · axial · 172.8mm · 0.94mm/px · 1 of 1 slices shown (3 of 3)]
[im 1/1]
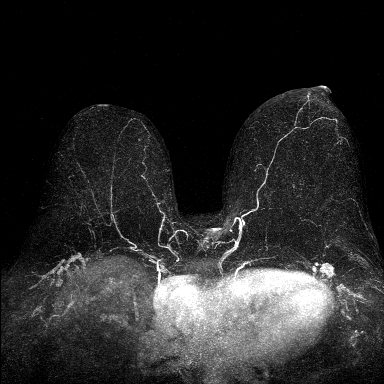

[Series 8: fl3d post-cm 3min · axial · 1.2mm · 0.94mm/px · z∈[-69,+103]mm · 6 of 144 slices shown]
[im 1/144]
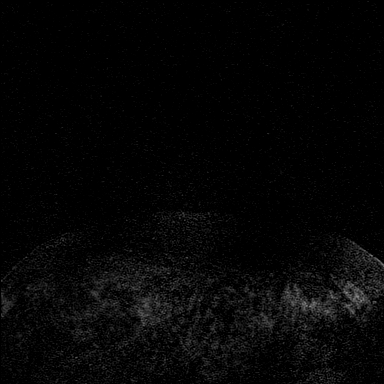
[im 29/144]
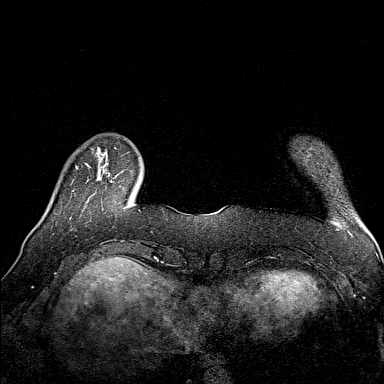
[im 58/144]
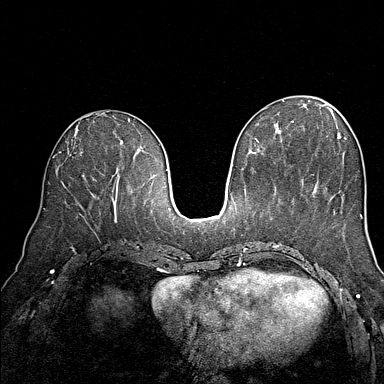
[im 86/144]
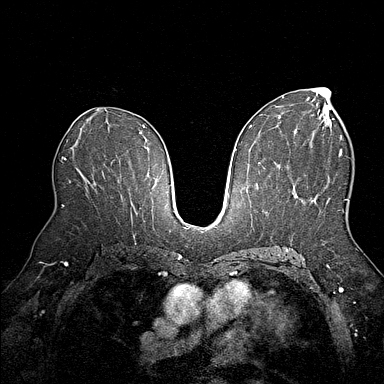
[im 115/144]
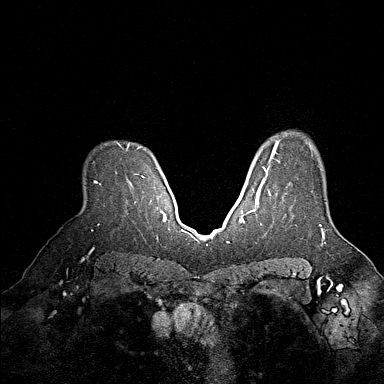
[im 144/144]
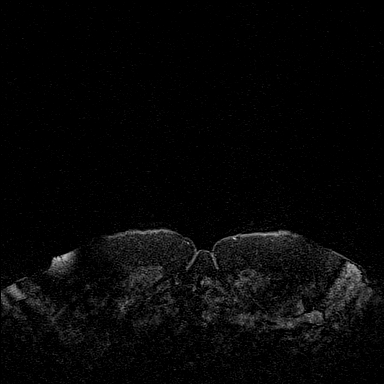

[Series 9: fl3d post-cm 3min_sub · axial · 1.2mm · 0.94mm/px · z∈[-69,+68]mm · 5 of 144 slices shown]
[im 1/144]
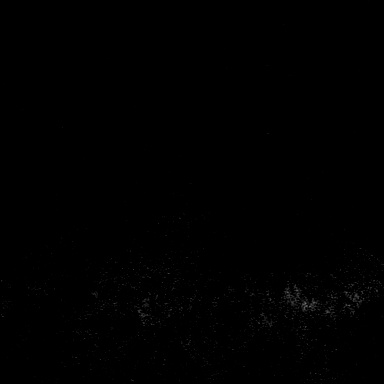
[im 29/144]
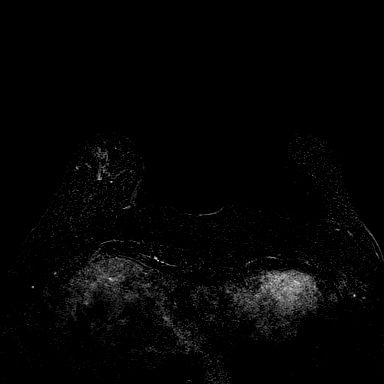
[im 58/144]
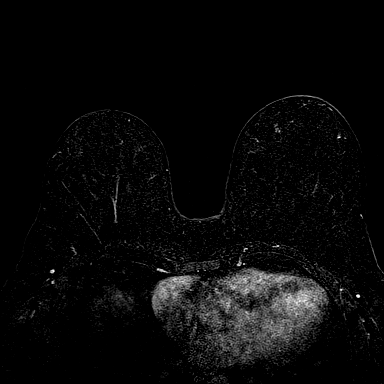
[im 86/144]
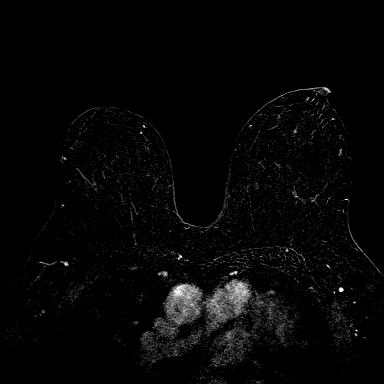
[im 115/144]
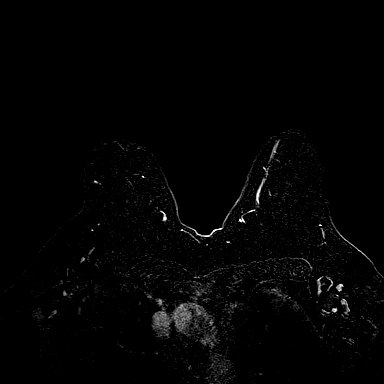

[33 of 48 positions shown; findings below may reference images not displayed]

Three-dimensional MR images were rendered by post-processing of the
original MR data on an independent workstation. The
three-dimensional MR images were interpreted, and findings are
reported in the following complete MRI report for this study. Three
dimensional images were evaluated at the independent interpreting
workstation using the DynaCAD thin client.
FINDINGS: Breast composition: b. Scattered fibroglandular tissue.

Background parenchymal enhancement: Mild.

Right breast: No suspicious mass or abnormal enhancement.

Left breast: No suspicious mass or abnormal enhancement.

Lymph nodes: No pathologic lymphadenopathy.

Ancillary findings:  None.
IMPRESSION: 1. No MRI evidence of malignancy involving either breast.
2. No pathologic lymphadenopathy.

RECOMMENDATION:
1. Annual BILATERAL screening mammography which is due in late
July 2021.
2. Given the patient's elevated lifetime risk of breast cancer of
35%, high risk supplemental screening MRI is also recommended in 1
year.

BI-RADS CATEGORY  1: Negative.
